# Patient Record
Sex: Male | Born: 2000
Health system: Southern US, Community
[De-identification: ages and names within clinical notes are randomized; demographics above are authoritative.]

## PROBLEM LIST (undated history)

## (undated) DIAGNOSIS — F938 Other childhood emotional disorders: Secondary | ICD-10-CM

## (undated) DIAGNOSIS — F32A Depression, unspecified: Secondary | ICD-10-CM

## (undated) DIAGNOSIS — G4700 Insomnia, unspecified: Secondary | ICD-10-CM

## (undated) DIAGNOSIS — J45909 Unspecified asthma, uncomplicated: Secondary | ICD-10-CM

## (undated) DIAGNOSIS — F329 Major depressive disorder, single episode, unspecified: Secondary | ICD-10-CM

## (undated) HISTORY — PX: NO PAST SURGERIES: SHX2092

---
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
## 2022-09-02 IMAGING — CR DG FOOT COMPLETE 3+V*R*
3 series · 3 of 3 positions shown · non-contrast
Comparison: 06/05/2020

CLINICAL DATA: Right foot injury

EXAM:
RIGHT FOOT COMPLETE - 3+ VIEW

[foot ap]
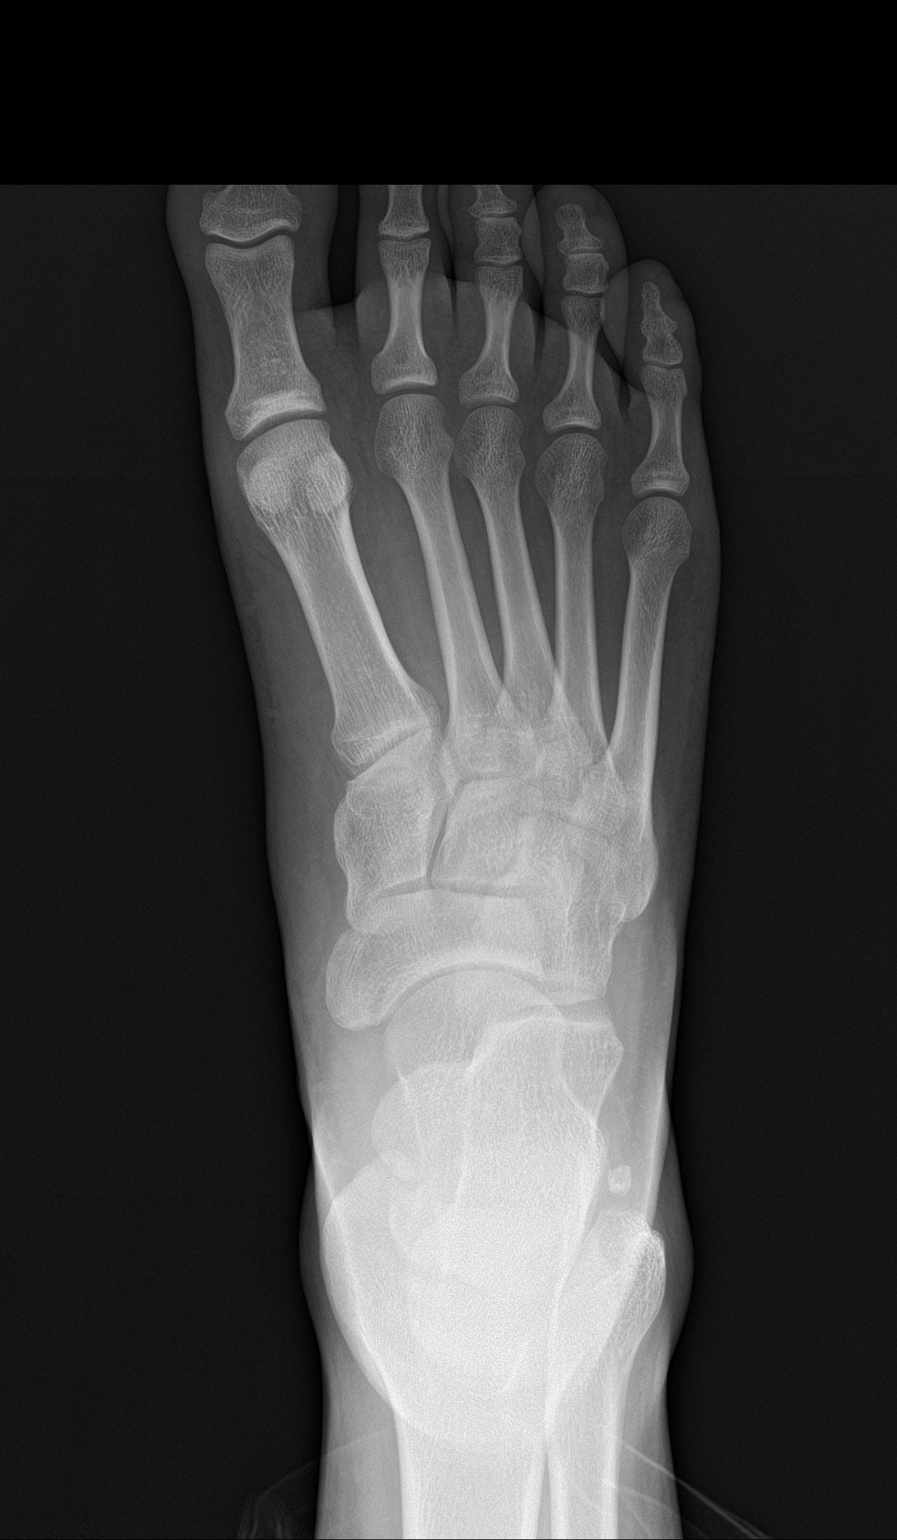

[foot obl]
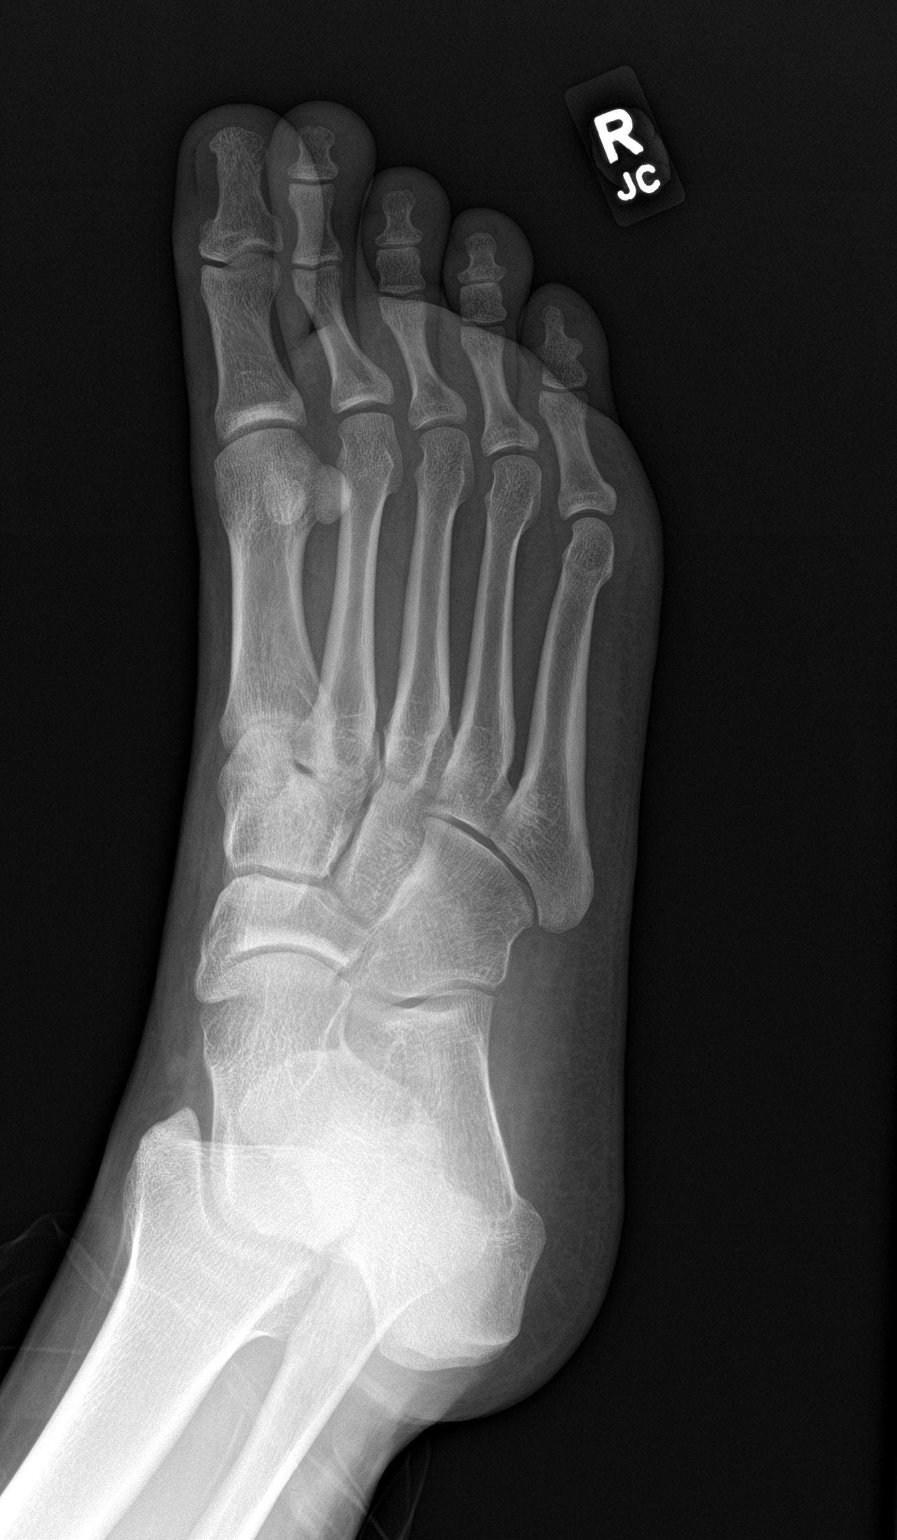

[foot lat]
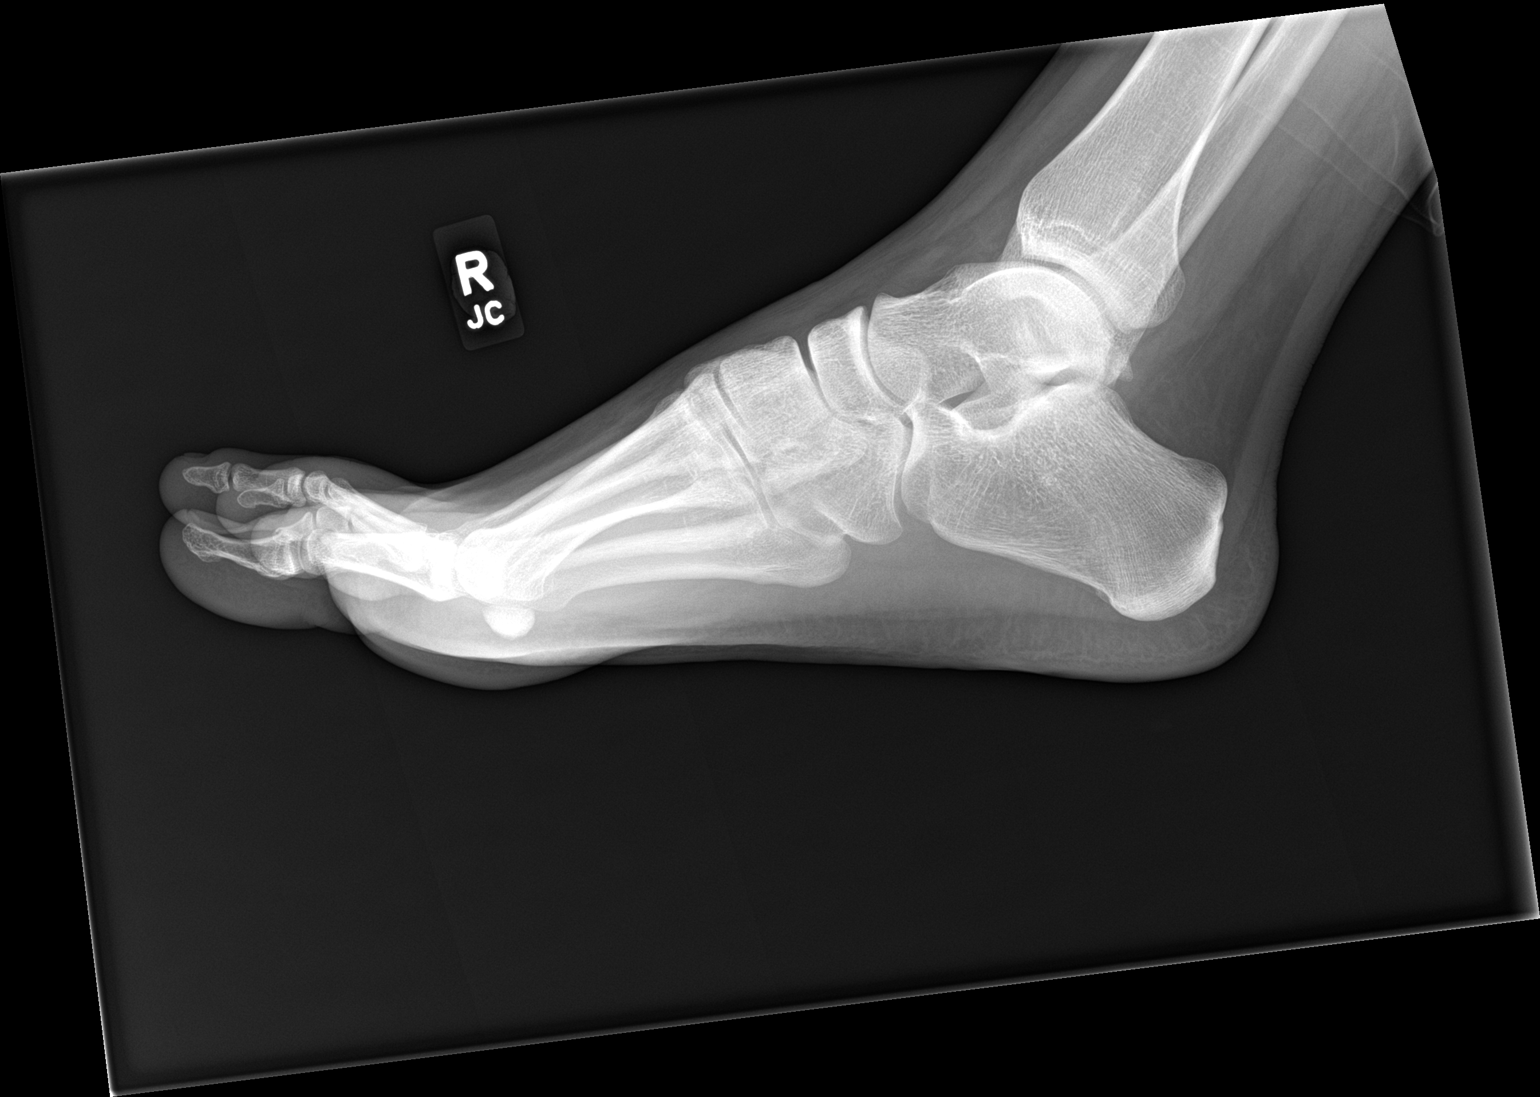

[3 of 3 positions shown; findings below may reference images not displayed]

FINDINGS: Frontal, oblique, and lateral views of the right foot are obtained.
No fracture, subluxation, or dislocation. Joint spaces are well
preserved. Soft tissues are normal.
IMPRESSION: 1. Unremarkable right foot.

## 2022-10-04 DIAGNOSIS — J020 Streptococcal pharyngitis: Secondary | ICD-10-CM | POA: Diagnosis not present

## 2022-10-04 DIAGNOSIS — J028 Acute pharyngitis due to other specified organisms: Secondary | ICD-10-CM | POA: Diagnosis not present

## 2022-11-13 IMAGING — DX DG CHEST 1V PORT
1 series · 1 of 1 positions shown · non-contrast
Comparison: 12/17/2018

CLINICAL DATA: COVID, CP

EXAM:
PORTABLE CHEST 1 VIEW

[chest ap]
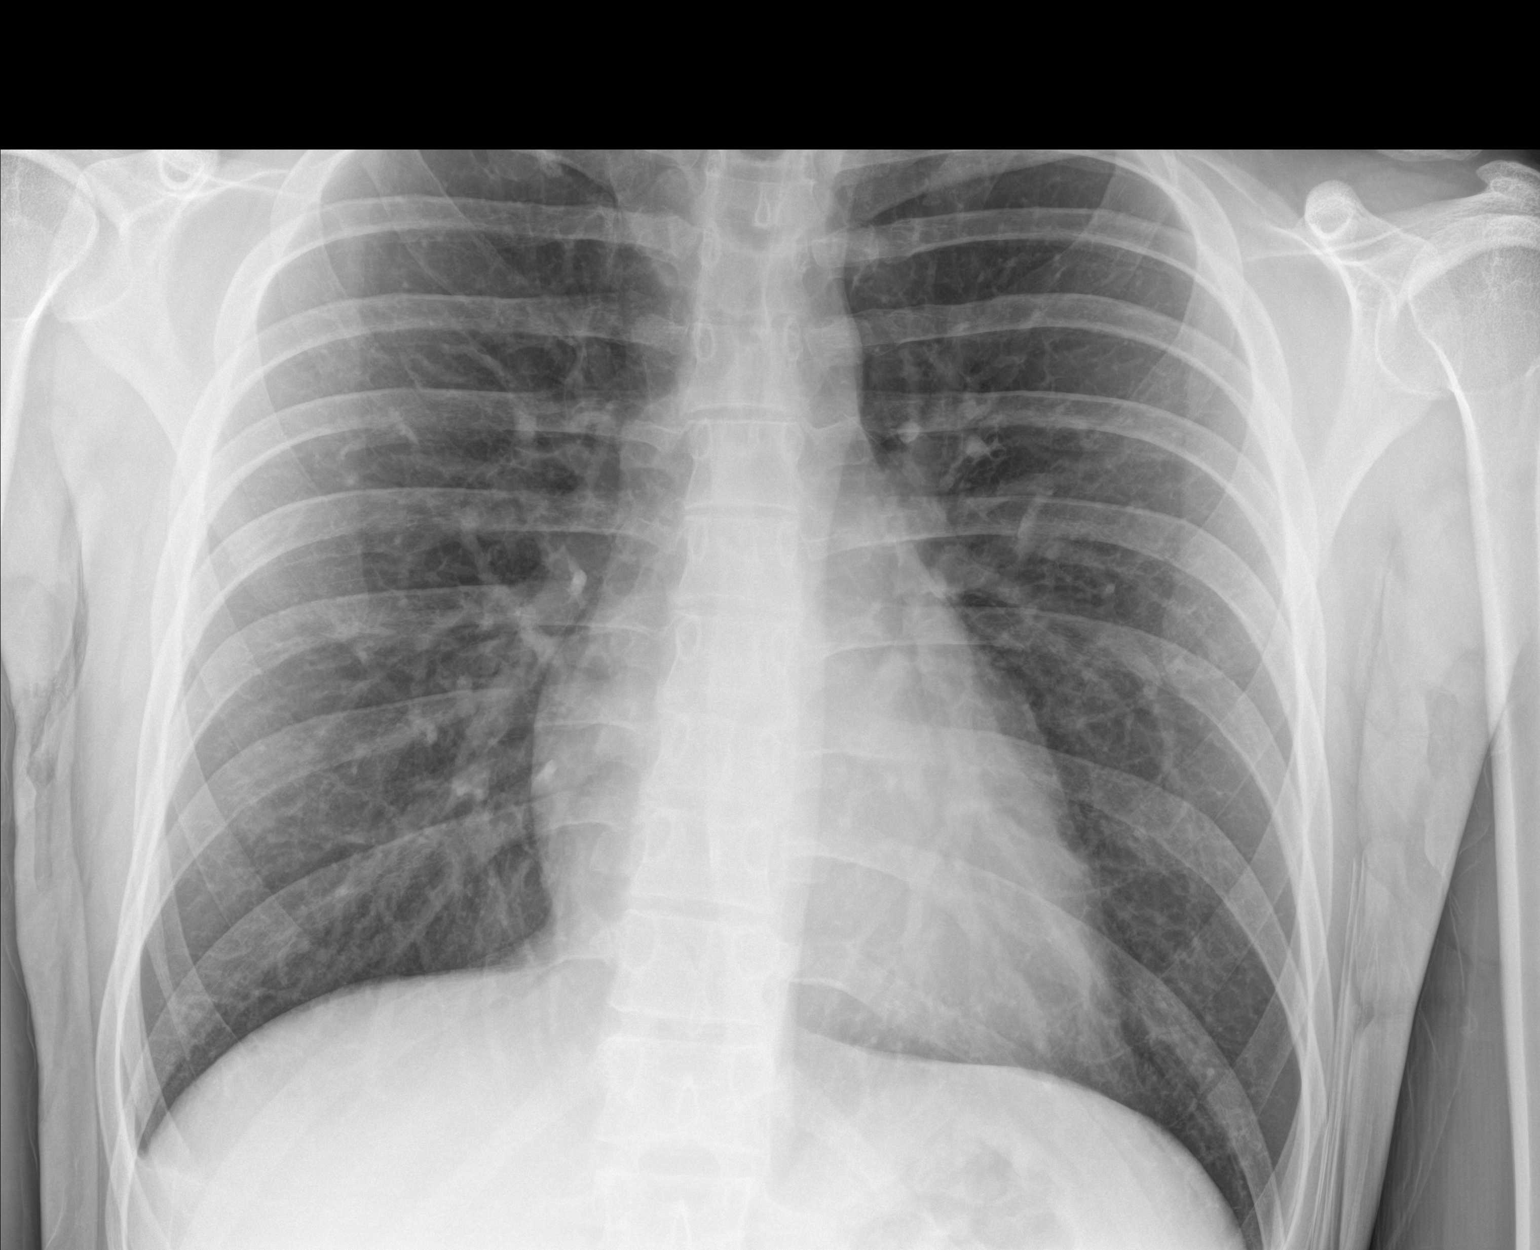

[1 of 1 positions shown; findings below may reference images not displayed]

FINDINGS: The heart size and mediastinal contours are within normal limits.No
focal airspace disease. No pleural effusion or pneumothorax.No acute
osseous abnormality.
IMPRESSION: No evidence of acute cardiopulmonary disease.

## 2023-04-25 DIAGNOSIS — R6889 Other general symptoms and signs: Secondary | ICD-10-CM | POA: Diagnosis not present

## 2023-04-25 DIAGNOSIS — S29012A Strain of muscle and tendon of back wall of thorax, initial encounter: Secondary | ICD-10-CM | POA: Diagnosis not present

## 2023-04-25 DIAGNOSIS — Z03818 Encounter for observation for suspected exposure to other biological agents ruled out: Secondary | ICD-10-CM | POA: Diagnosis not present

## 2023-05-29 ENCOUNTER — Ambulatory Visit: Payer: Self-pay | Admitting: Family Medicine

## 2024-01-02 ENCOUNTER — Ambulatory Visit: Admitting: Family Medicine

## 2024-01-16 ENCOUNTER — Ambulatory Visit: Admitting: Family Medicine

## 2024-01-16 ENCOUNTER — Encounter: Payer: Self-pay | Admitting: Family Medicine

## 2024-01-16 VITALS — BP 108/68 | HR 66 | Temp 98.3°F | Resp 18 | Ht 74.0 in | Wt 169.4 lb

## 2024-01-16 DIAGNOSIS — Z0001 Encounter for general adult medical examination with abnormal findings: Secondary | ICD-10-CM

## 2024-01-16 DIAGNOSIS — Z23 Encounter for immunization: Secondary | ICD-10-CM | POA: Diagnosis not present

## 2024-01-16 DIAGNOSIS — Z0000 Encounter for general adult medical examination without abnormal findings: Secondary | ICD-10-CM | POA: Insufficient documentation

## 2024-01-16 DIAGNOSIS — H66005 Acute suppurative otitis media without spontaneous rupture of ear drum, recurrent, left ear: Secondary | ICD-10-CM | POA: Diagnosis not present

## 2024-01-16 DIAGNOSIS — F17200 Nicotine dependence, unspecified, uncomplicated: Secondary | ICD-10-CM

## 2024-01-16 MED ORDER — NICOTINE 14 MG/24HR TD PT24: 14.0000 mg | MEDICATED_PATCH | Freq: Every day | TRANSDERMAL | 0 refills | Status: AC

## 2024-01-16 MED ORDER — AMOXICILLIN-POT CLAVULANATE 875-125 MG PO TABS: 1.0000 | ORAL_TABLET | Freq: Two times a day (BID) | ORAL | 0 refills | Status: AC

## 2024-01-16 NOTE — Progress Notes (Unsigned)
 Subjective:  Patient ID: Jordan Powell, male    DOB: Apr 06, 2001  Age: 23 y.o. MRN: [id number]  Chief Complaint  Patient presents with  . Establish Care  . Annual Exam    Well Adult Physical: Patient here for a comprehensive physical exam.The patient reports problems - headaches Do you take any herbs or supplements that were not prescribed by a doctor? no Are you taking calcium supplements? no Are you taking aspirin daily? no  Encounter for general adult medical examination without abnormal findings  Physical (At Risk items are starred): Patient's last physical exam was 1 year ago .  Patient wears a seat belt, has smoke detectors, has carbon monoxide detectors, practices appropriate gun safety, and wears sunscreen with extended sun exposure. Dental Care: biannual cleanings, brushes and flosses daily. Ophthalmology/Optometry: Annual visit.  Hearing loss: none Vision impairments: none Last PSA:     12/30/2015   10:51 AM  Depression screen PHQ 2/9  Decreased Interest 0  Down, Depressed, Hopeless 0  PHQ - 2 Score 0         09/17/2020    7:44 PM 11/28/2020    1:57 PM 01/16/2024    2:21 PM  Fall Risk  Falls in the past year?   0  Was there an injury with Fall?   0  Fall Risk Category Calculator   0  (RETIRED) Patient Fall Risk Level Low fall risk  Low fall risk    Patient at Risk for Falls Due to   No Fall Risks  Fall risk Follow up   Falls evaluation completed     Data saved with a previous flowsheet row definition              Past Medical History:  Diagnosis Date  . 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 Asthma   . Depression   . Insomnia 02/10/2016   Past Surgical History:  Procedure Laterality Date  . NO PAST SURGERIES      Family History  Problem Relation Age of Onset  . Arthritis Mother   . Asthma Mother   . Depression Mother   . Alcohol abuse Father   . Heart failure Father   . COPD Father   . Anxiety disorder Father   . Depression Father    . Asthma Father   . Bipolar disorder Sister   . Anxiety disorder Brother   . Depression Brother   . Depression Brother   . Anxiety disorder Brother   . Heart attack Maternal Grandmother    Social History   Socioeconomic History  . Marital status: Single    Spouse name: Not on file  . Number of children: Not on file  . Years of education: Not on file  . Highest education level: Not on file  Occupational History  . Not on file  Tobacco Use  . Smoking status: Never  . Smokeless tobacco: Never  Vaping Use  . Vaping status: Every Day  Substance and Sexual Activity  . Alcohol use: No  . Drug use: No  . Sexual activity: Yes  Other Topics Concern  . Not on file  Social History Narrative  . Not on file   Social Drivers of Health   Financial Resource Strain: Not on file  Food Insecurity: Not on file  Transportation Needs: Not on file  Physical Activity: Not on file  Stress: Not on file  Social Connections: Not on file   Review of Systems  Constitutional:  Negative for appetite  change, fatigue and fever.  HENT:  Negative for congestion, ear pain, sinus pressure and sore throat.   Eyes: Negative.   Respiratory:  Negative for cough, chest tightness, shortness of breath and wheezing.   Cardiovascular:  Negative for chest pain and palpitations.  Gastrointestinal:  Negative for abdominal pain, constipation, diarrhea, nausea and vomiting.  Endocrine: Negative.   Genitourinary:  Negative for dysuria, frequency, hematuria and urgency.  Musculoskeletal:  Negative for arthralgias, back pain, joint swelling and myalgias.  Skin:  Negative for rash.  Allergic/Immunologic: Negative.   Neurological:  Positive for headaches. Negative for dizziness, weakness and light-headedness.  Hematological: Negative.   Psychiatric/Behavioral:  Negative for dysphoric mood. The patient is not nervous/anxious.      Objective:  BP 108/68   Pulse 66   Temp 98.3 F (36.8 C) (Temporal)   Resp 18    Ht 6' 2 (1.88 m)   Wt 169 lb 6.4 oz (76.8 kg)   SpO2 98%   BMI 21.75 kg/m      01/16/2024    2:15 PM 11/28/2020    1:55 PM 09/17/2020   10:17 PM  BP/Weight  Systolic BP 108 113 113  Diastolic BP 68 78 76  Wt. (Lbs) 169.4 200   BMI 21.75 kg/m2 25.68 kg/m2     Physical Exam Vitals reviewed.  Constitutional:      General: He is not in acute distress.    Appearance: Normal appearance. He is well-groomed and normal weight. He is not ill-appearing.  HENT:     Head: Normocephalic.     Right Ear: Tympanic membrane, ear canal and external ear normal. There is no impacted cerumen.     Left Ear: Ear canal and external ear normal. Tenderness present. There is no impacted cerumen. Tympanic membrane is erythematous.     Nose: Nose normal. No congestion or rhinorrhea.     Mouth/Throat:     Mouth: Mucous membranes are moist.     Dentition: Abnormal dentition. Dental caries present.     Pharynx: Oropharynx is clear. Posterior oropharyngeal erythema present.     Tonsils: 1+ on the right. 1+ on the left.  Eyes:     Extraocular Movements: Extraocular movements intact.     Conjunctiva/sclera: Conjunctivae normal.     Pupils: Pupils are equal, round, and reactive to light.  Cardiovascular:     Rate and Rhythm: Normal rate and regular rhythm.     Pulses: Normal pulses.     Heart sounds: Murmur heard.  Pulmonary:     Effort: Pulmonary effort is normal. No respiratory distress.     Breath sounds: Normal breath sounds. No wheezing or rhonchi.  Abdominal:     General: Bowel sounds are normal.     Palpations: Abdomen is soft.  Musculoskeletal:        General: Normal range of motion.     Cervical back: Normal range of motion and neck supple.  Lymphadenopathy:     Cervical: No cervical adenopathy.  Skin:    General: Skin is warm and dry.  Neurological:     General: No focal deficit present.     Mental Status: He is alert and oriented to person, place, and time. Mental status is at baseline.      Cranial Nerves: Cranial nerves 2-12 are intact.     Sensory: Sensation is intact.     Motor: Motor function is intact.     Coordination: Coordination is intact.     Gait: Gait is intact.  Deep Tendon Reflexes: Reflexes are normal and symmetric.  Psychiatric:        Attention and Perception: Attention normal.        Mood and Affect: Mood normal.        Speech: Speech normal.        Behavior: Behavior normal. Behavior is cooperative.        Thought Content: Thought content normal.        Judgment: Judgment normal.     Lab Results  Component Value Date   WBC 14.1 (H) 12/17/2018   HGB 14.8 12/17/2018   HCT 42.3 12/17/2018   PLT 281 12/17/2018   GLUCOSE 106 (H) 12/17/2018   ALT 12 12/17/2018   AST 15 12/17/2018   NA 140 12/17/2018   K 3.4 (L) 12/17/2018   CL 107 12/17/2018   CREATININE 0.76 12/17/2018   BUN 16 12/17/2018   CO2 25 12/17/2018   TSH 1.289 02/11/2016      Assessment & Plan:  Encounter for general adult medical examination with abnormal findings -     CBC with Differential/Platelet -     Comprehensive metabolic panel with GFR -     Lipid panel -     TSH  Recurrent acute suppurative otitis media without spontaneous rupture of left tympanic membrane -     Amoxicillin -Pot Clavulanate; Take 1 tablet by mouth 2 (two) times daily for 7 days.  Dispense: 14 tablet; Refill: 0  Immunization due -     Flu vaccine trivalent PF, 6mos and older(Flulaval,Afluria,Fluarix,Fluzone)  Nicotine  dependence with current use -     Nicotine ; Place 1 patch (14 mg total) onto the skin daily.  Dispense: 28 patch; Refill: 0    These are the goals we discussed:  Goals     . DIET - REDUCE SUGAR INTAKE     - Reduce sugar and caffeine intake    . Quit Smoking     - Wants to quit smoking - Sent nicotine  patch         This is a list of the screening recommended for you and due dates:  Health Maintenance  Topic Date Due  . HIV Screening  Never done  . Meningitis B  Vaccine (2 of 2 - Bexsero SCDM 2-dose series) 05/27/2017  . Hepatitis C Screening  Never done  . Pneumococcal Vaccine (1 of 2 - PCV) 05/27/2020  . DTaP/Tdap/Td vaccine (7 - Td or Tdap) 10/24/2022  . COVID-19 Vaccine (4 - 2025-26 season) 02/01/2024*  . Flu Shot  Completed  . HPV Vaccine  Completed  . Hepatitis B Vaccine  Discontinued  *Topic was postponed. The date shown is not the original due date.     Meds ordered this encounter  Medications  . amoxicillin -clavulanate (AUGMENTIN ) 875-125 MG tablet    Sig: Take 1 tablet by mouth 2 (two) times daily for 7 days.    Dispense:  14 tablet    Refill:  0  . nicotine  (NICODERM CQ ) 14 mg/24hr patch    Sig: Place 1 patch (14 mg total) onto the skin daily.    Dispense:  28 patch    Refill:  0     Follow-up: Return in about 1 year (around 01/15/2025) for Annual Physical, fasting.  An After Visit Summary was printed and given to the patient.  Harrie Cedar, FNP [facility] [phone]

## 2024-01-17 ENCOUNTER — Ambulatory Visit: Payer: Self-pay | Admitting: Family Medicine

## 2024-01-17 LAB — LIPID PANEL
Chol/HDL Ratio: 4.3 ratio (ref 0.0–5.0)
Cholesterol, Total: 176 mg/dL (ref 100–199)
HDL: 41 mg/dL (ref >39)
LDL Chol Calc (NIH): 118 mg/dL — ABNORMAL HIGH (ref 0–99)
Triglycerides: 91 mg/dL (ref 0–149)
VLDL Cholesterol Cal: 17 mg/dL (ref 5–40)

## 2024-01-17 LAB — COMPREHENSIVE METABOLIC PANEL WITH GFR
ALT: 13 IU/L (ref 0–44)
AST: 13 IU/L (ref 0–40)
Albumin: 4.4 g/dL (ref 4.3–5.2)
Alkaline Phosphatase: 55 IU/L (ref 47–123)
BUN/Creatinine Ratio: 9 (ref 9–20)
BUN: 8 mg/dL (ref 6–20)
Bilirubin Total: 0.8 mg/dL (ref 0.0–1.2)
CO2: 22 mmol/L (ref 20–29)
Calcium: 9.2 mg/dL (ref 8.7–10.2)
Chloride: 104 mmol/L (ref 96–106)
Creatinine, Ser: 0.88 mg/dL (ref 0.76–1.27)
Globulin, Total: 2.1 g/dL (ref 1.5–4.5)
Glucose: 85 mg/dL (ref 70–99)
Potassium: 3.9 mmol/L (ref 3.5–5.2)
Sodium: 140 mmol/L (ref 134–144)
Total Protein: 6.5 g/dL (ref 6.0–8.5)
eGFR: 125 mL/min/1.73 (ref >59)

## 2024-01-17 LAB — CBC WITH DIFFERENTIAL/PLATELET
Basophils Absolute: 0.1 x10E3/uL (ref 0.0–0.2)
Basos: 1 %
EOS (ABSOLUTE): 0 x10E3/uL (ref 0.0–0.4)
Eos: 1 %
Hematocrit: 47.4 % (ref 37.5–51.0)
Hemoglobin: 16.1 g/dL (ref 13.0–17.7)
Immature Grans (Abs): 0 x10E3/uL (ref 0.0–0.1)
Immature Granulocytes: 0 %
Lymphocytes Absolute: 1.7 x10E3/uL (ref 0.7–3.1)
Lymphs: 23 %
MCH: 30.4 pg (ref 26.6–33.0)
MCHC: 34 g/dL (ref 31.5–35.7)
MCV: 90 fL (ref 79–97)
Monocytes Absolute: 0.6 x10E3/uL (ref 0.1–0.9)
Monocytes: 7 %
Neutrophils Absolute: 5.1 x10E3/uL (ref 1.4–7.0)
Neutrophils: 68 %
Platelets: 317 x10E3/uL (ref 150–450)
RBC: 5.29 x10E6/uL (ref 4.14–5.80)
RDW: 12.2 % (ref 11.6–15.4)
WBC: 7.4 x10E3/uL (ref 3.4–10.8)

## 2024-01-17 LAB — TSH: TSH: 0.823 u[IU]/mL (ref 0.450–4.500)

## 2024-01-17 NOTE — Assessment & Plan Note (Signed)
 Acute left otitis media Acute left otitis media with recurrent infections, possibly causing headaches. No antibiotic allergies. - Prescribed antibiotic for left ear infection, twice daily for five days. - Advised on potential gastrointestinal upset from antibiotics and suggested yogurt to mitigate symptoms.

## 2024-01-17 NOTE — Assessment & Plan Note (Addendum)
 Discussed lifestyle modifications and preventive care. - Ordered lab work: thyroid, cholesterol, liver and kidney function, CBC. - Encouraged annual eye exams and biannual dental visits. - Advised on the use of sunblock and smoke detectors. - Encouraged reduction of sugar, caffeine, and nicotine  intake.   Things to do to keep yourself healthy  - Exercise at least 30-45 minutes a day, 3-4 days a week.  - Eat a low-fat diet with lots of fruits and vegetables, up to 7-9 servings per day.  - Seatbelts can save your life. Wear them always.  - Smoke detectors on every level of your home, check batteries every year.  - Eye Doctor - have an eye exam every 1-2 years  - Safe sex - if you may be exposed to STDs, use a condom.  - Alcohol -  If you drink, do it moderately, less than 2 drinks per day.  - Health Care Power of Attorney. Choose someone to speak for you if you are not able.  - Depression is common in our stressful world.If you're feeling down or losing interest in things you normally enjoy, please come in for a visit.  - Violence - If anyone is threatening or hurting you, please call immediately.

## 2024-01-17 NOTE — Assessment & Plan Note (Signed)
 Nicotine  dependence Nicotine  dependence with motivation to quit. Prefers lower dose nicotine  patch to avoid withdrawal symptoms. - Prescribed lower dose nicotine  patch. - Advised on gradual reduction of nicotine  intake.

## 2024-02-11 DIAGNOSIS — F32A Depression, unspecified: Secondary | ICD-10-CM | POA: Diagnosis not present

## 2024-02-11 DIAGNOSIS — F4312 Post-traumatic stress disorder, chronic: Secondary | ICD-10-CM | POA: Diagnosis not present

## 2025-01-21 ENCOUNTER — Encounter: Admitting: Family Medicine
